# Patient Record
Sex: Male | Born: 1970 | Race: White | Hispanic: No | Marital: Single | Smoking: Never smoker
Health system: Southern US, Community
[De-identification: ages and names within clinical notes are randomized; demographics above are authoritative.]

---

## 2004-01-03 ENCOUNTER — Encounter: Admission: RE | Admit: 2004-01-03 | Discharge: 2004-01-03 | Payer: Self-pay | Admitting: Internal Medicine

## 2006-11-18 ENCOUNTER — Ambulatory Visit: Payer: Self-pay | Admitting: Internal Medicine

## 2007-04-28 ENCOUNTER — Ambulatory Visit: Payer: Self-pay | Admitting: Internal Medicine

## 2007-10-16 ENCOUNTER — Ambulatory Visit: Payer: Self-pay | Admitting: Internal Medicine

## 2007-11-13 ENCOUNTER — Ambulatory Visit: Payer: Self-pay | Admitting: Internal Medicine

## 2007-12-15 ENCOUNTER — Ambulatory Visit: Payer: Self-pay | Admitting: Internal Medicine

## 2007-12-25 ENCOUNTER — Ambulatory Visit: Payer: Self-pay | Admitting: Internal Medicine

## 2007-12-27 LAB — CONVERTED CEMR LAB
CO2: 28 meq/L (ref 19–32)
GFR calc non Af Amer: 90 mL/min
Potassium: 3.9 meq/L (ref 3.5–5.1)
Sodium: 134 meq/L — ABNORMAL LOW (ref 135–145)

## 2009-01-10 ENCOUNTER — Ambulatory Visit: Payer: Self-pay | Admitting: Family Medicine

## 2009-01-10 DIAGNOSIS — M79609 Pain in unspecified limb: Secondary | ICD-10-CM

## 2009-05-28 ENCOUNTER — Ambulatory Visit: Payer: Self-pay | Admitting: Family Medicine

## 2009-05-28 DIAGNOSIS — J029 Acute pharyngitis, unspecified: Secondary | ICD-10-CM | POA: Insufficient documentation

## 2009-05-29 ENCOUNTER — Encounter: Payer: Self-pay | Admitting: Family Medicine

## 2009-06-03 ENCOUNTER — Telehealth (INDEPENDENT_AMBULATORY_CARE_PROVIDER_SITE_OTHER): Payer: Self-pay | Admitting: *Deleted

## 2010-05-07 IMAGING — CR DG TOE 4TH 2+V*L*
1 series · 1 of 1 positions shown · non-contrast
Comparison: None

CLINICAL DATA: Toe pain.

LEFT TOE - 2+ VIEW

[view not recorded]
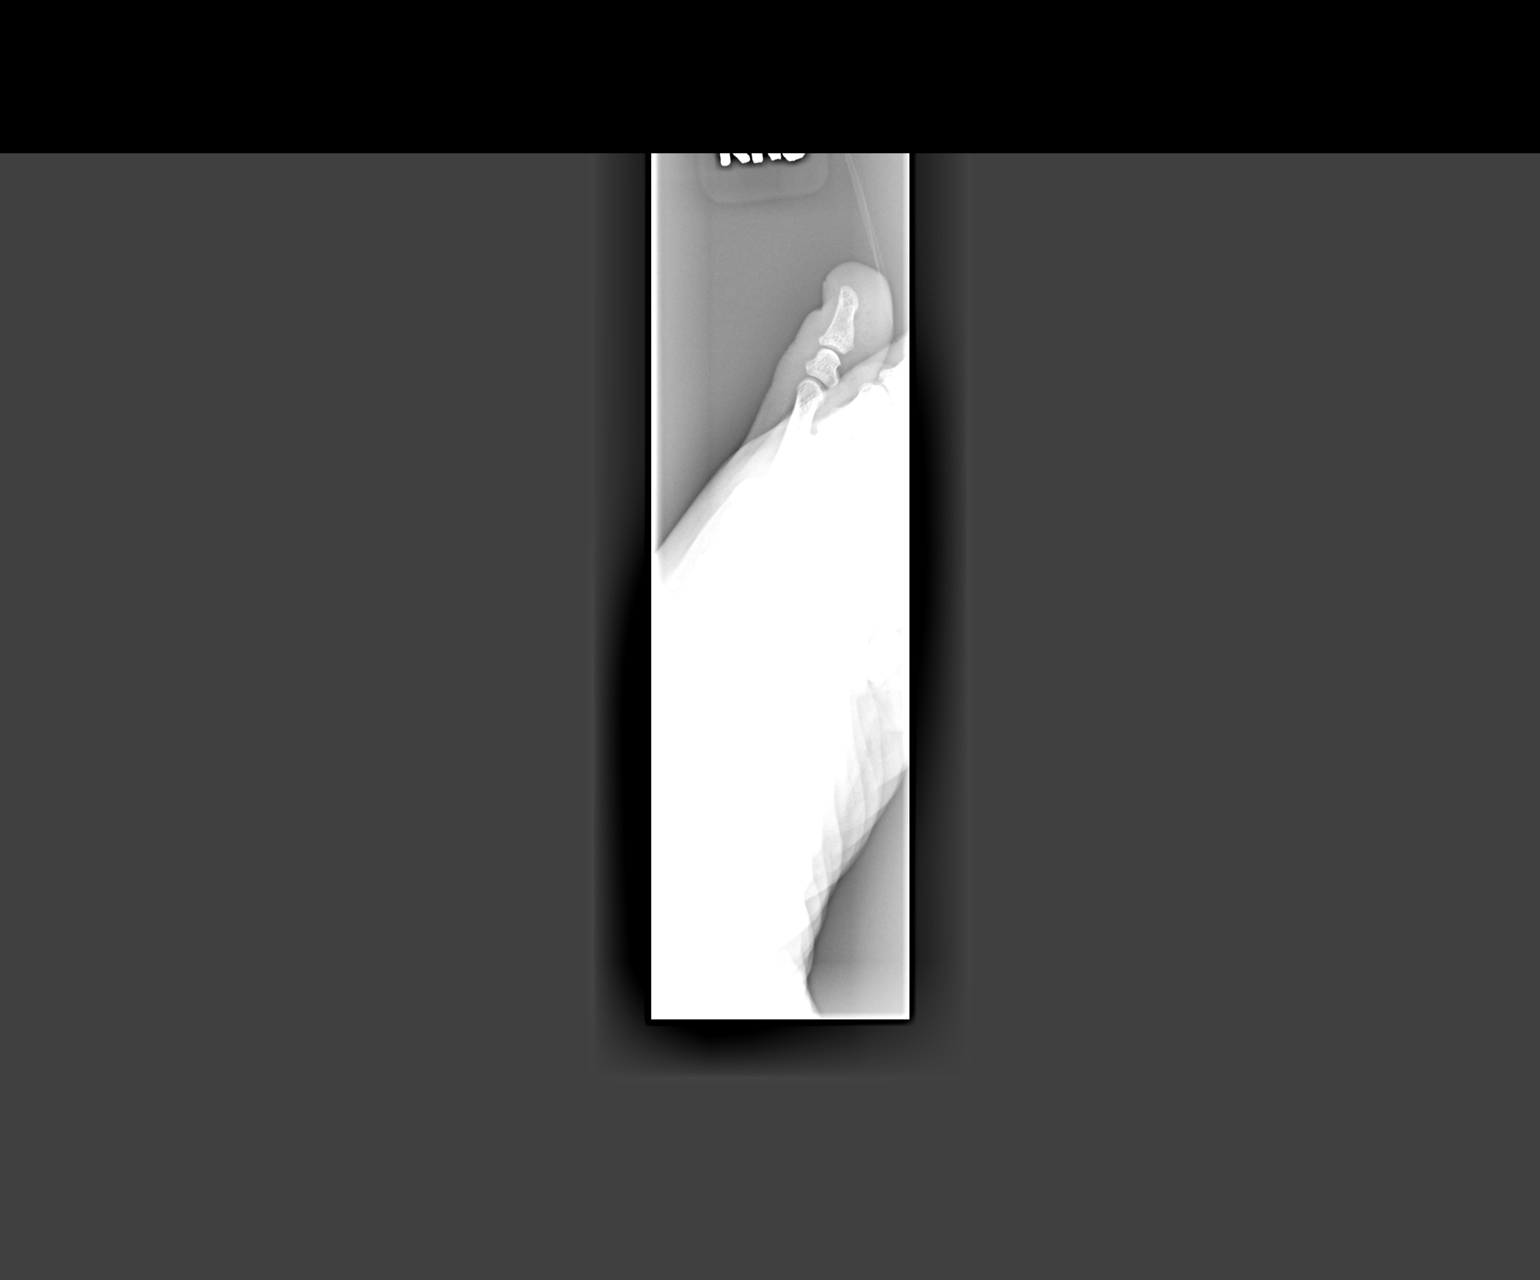

[1 of 1 positions shown; findings below may reference images not displayed]

FINDINGS: No acute bony abnormality.  Specifically, no fracture,
subluxation, or dislocation.  Soft tissues are intact.
IMPRESSION: No acute findings.

## 2010-10-18 LAB — CONVERTED CEMR LAB
BUN: 10 mg/dL (ref 6–23)
Basophils Absolute: 0 10*3/uL (ref 0.0–0.1)
Bilirubin, Direct: 0.1 mg/dL (ref 0.0–0.3)
CO2: 30 meq/L (ref 19–32)
Calcium: 9.5 mg/dL (ref 8.4–10.5)
Chloride: 102 meq/L (ref 96–112)
Creatinine, Ser: 1.7 mg/dL — ABNORMAL HIGH (ref 0.4–1.5)
Eosinophils Relative: 0.5 % (ref 0.0–5.0)
Glucose, Bld: 108 mg/dL — ABNORMAL HIGH (ref 70–99)
HCT: 45.1 % (ref 39.0–52.0)
HDL: 38.5 mg/dL — ABNORMAL LOW (ref >39.0)
LDL Cholesterol: 128 mg/dL — ABNORMAL HIGH (ref 0–99)
Lymphocytes Relative: 19.1 % (ref 12.0–46.0)
MCHC: 34 g/dL (ref 30.0–36.0)
MCV: 91.3 fL (ref 78.0–100.0)
Neutrophils Relative %: 72.4 % (ref 43.0–77.0)
Platelets: 235 10*3/uL (ref 150–400)
RDW: 12.6 % (ref 11.5–14.6)
Sodium: 139 meq/L (ref 135–145)
Total Bilirubin: 1 mg/dL (ref 0.3–1.2)
Total Protein: 7.4 g/dL (ref 6.0–8.3)

## 2013-08-01 ENCOUNTER — Encounter: Payer: Self-pay | Admitting: Family Medicine

## 2013-08-01 ENCOUNTER — Ambulatory Visit (INDEPENDENT_AMBULATORY_CARE_PROVIDER_SITE_OTHER): Payer: BC Managed Care – PPO | Admitting: Family Medicine

## 2013-08-01 VITALS — BP 140/80 | HR 98 | Temp 98.1°F | Resp 16 | Wt 187.2 lb

## 2013-08-01 DIAGNOSIS — R369 Urethral discharge, unspecified: Secondary | ICD-10-CM

## 2013-08-01 DIAGNOSIS — R300 Dysuria: Secondary | ICD-10-CM

## 2013-08-01 DIAGNOSIS — R309 Painful micturition, unspecified: Secondary | ICD-10-CM

## 2013-08-01 LAB — POCT URINALYSIS DIPSTICK
Bilirubin, UA: NEGATIVE
Glucose, UA: NEGATIVE
Protein, UA: NEGATIVE
Urobilinogen, UA: 0.2
pH, UA: 5

## 2013-08-01 MED ORDER — CIPROFLOXACIN HCL 500 MG PO TABS: 500.0000 mg | ORAL_TABLET | Freq: Two times a day (BID) | ORAL | Status: DC

## 2013-08-01 NOTE — Assessment & Plan Note (Signed)
New.  Check for STDs given report of discharge

## 2013-08-01 NOTE — Assessment & Plan Note (Signed)
New.  UA consistent w/ infxn.  Start abx.

## 2013-08-01 NOTE — Patient Instructions (Signed)
Follow up as needed Start the Cipro twice daily Refrain from sexual activity until lab results available We'll notify you of your lab results and make any changes if needed Call with any questions or concerns Happy Holidays!

## 2013-08-01 NOTE — Progress Notes (Signed)
  Subjective:    Patient ID: Dustin Simon, male    DOB: 01-15-1971, 42 y.o.   MRN: [id number]  HPI Pre visit review using our clinic review tool, if applicable. No additional management support is needed unless otherwise documented below in the visit note.  Dysuria- sxs started 10 days ago.  Increased frequency, + hesitancy, incomplete emptying.  No fevers.  + green discharge.    Review of Systems For ROS see HPI     Objective:   Physical Exam  Vitals reviewed. Constitutional: He appears well-developed and well-nourished. No distress.  Abdominal: Soft. Bowel sounds are normal. He exhibits no distension. There is no tenderness. There is no rebound and no guarding.  No CVA tenderness          Assessment & Plan:

## 2013-08-08 ENCOUNTER — Ambulatory Visit: Payer: Self-pay | Admitting: Family Medicine

## 2013-08-08 ENCOUNTER — Ambulatory Visit (INDEPENDENT_AMBULATORY_CARE_PROVIDER_SITE_OTHER): Payer: BC Managed Care – PPO | Admitting: *Deleted

## 2013-08-08 DIAGNOSIS — A549 Gonococcal infection, unspecified: Secondary | ICD-10-CM

## 2013-08-08 DIAGNOSIS — A5400 Gonococcal infection of lower genitourinary tract, unspecified: Secondary | ICD-10-CM

## 2013-08-08 MED ORDER — CEFTRIAXONE SODIUM 1 G IJ SOLR
500.0000 mg | Freq: Once | INTRAMUSCULAR | Status: AC
  Administered 2013-08-08: 500 mg via INTRAMUSCULAR

## 2014-03-28 ENCOUNTER — Ambulatory Visit (INDEPENDENT_AMBULATORY_CARE_PROVIDER_SITE_OTHER): Payer: BC Managed Care – PPO | Admitting: Family Medicine

## 2014-03-28 ENCOUNTER — Encounter: Payer: Self-pay | Admitting: Family Medicine

## 2014-03-28 VITALS — BP 132/88 | HR 83 | Temp 98.0°F | Resp 16 | Wt 190.5 lb

## 2014-03-28 DIAGNOSIS — H00019 Hordeolum externum unspecified eye, unspecified eyelid: Secondary | ICD-10-CM

## 2014-03-28 DIAGNOSIS — H00016 Hordeolum externum left eye, unspecified eyelid: Secondary | ICD-10-CM

## 2014-03-28 MED ORDER — SULFACETAMIDE SODIUM 10 % OP SOLN: OPHTHALMIC | Status: AC

## 2014-03-28 NOTE — Progress Notes (Signed)
   Subjective:    Patient ID: Dustin Simon, male    DOB: 10-20-1970, 43 y.o.   MRN: [id number]  HPI L eye swelling- sxs started yesterday.  Had similar 6 yrs ago.  Somewhat itchy and sore.  Is not impacting visual fields.  No drainage from eye.   Review of Systems For ROS see HPI     Objective:   Physical Exam  Vitals reviewed. Constitutional: He appears well-developed and well-nourished. No distress.  HENT:  Head: Normocephalic and atraumatic.  Eyes: Conjunctivae and EOM are normal. Pupils are equal, round, and reactive to light.  Small stye w/o evidence of cellulitis on L upper lid          Assessment & Plan:

## 2014-03-28 NOTE — Progress Notes (Signed)
Pre visit review using our clinic review tool, if applicable. No additional management support is needed unless otherwise documented below in the visit note. 

## 2014-03-28 NOTE — Patient Instructions (Signed)
Follow up as needed This is a stye Hot compresses will help! Use the solution if you are able to find it- if not, it will get better on it's own, but it can take some time! Call with any questions or concerns Hang in there!

## 2014-03-29 NOTE — Assessment & Plan Note (Signed)
New to provider, recurrent for pt.  Reviewed supportive care but pt is interested in having script for what he used last time- 'it worked'.  Script for [facility] given along w/ instructions for use.
